# Patient Record
Sex: Male | Born: 1935 | Race: White | Hispanic: No | Marital: Married
Health system: Southern US, Community
[De-identification: ages and names within clinical notes are randomized; demographics above are authoritative.]

---

## 2003-12-26 ENCOUNTER — Other Ambulatory Visit: Payer: Self-pay

## 2004-01-05 ENCOUNTER — Other Ambulatory Visit: Payer: Self-pay

## 2004-07-14 ENCOUNTER — Emergency Department: Payer: Self-pay | Admitting: Emergency Medicine

## 2004-07-31 ENCOUNTER — Other Ambulatory Visit: Payer: Self-pay

## 2004-07-31 ENCOUNTER — Emergency Department: Payer: Self-pay | Admitting: Unknown Physician Specialty

## 2005-11-18 ENCOUNTER — Other Ambulatory Visit: Payer: Self-pay

## 2005-11-18 ENCOUNTER — Inpatient Hospital Stay: Payer: Self-pay | Admitting: Internal Medicine

## 2005-11-19 ENCOUNTER — Other Ambulatory Visit: Payer: Self-pay

## 2005-11-20 ENCOUNTER — Other Ambulatory Visit: Payer: Self-pay

## 2005-11-30 ENCOUNTER — Other Ambulatory Visit: Payer: Self-pay

## 2005-11-30 ENCOUNTER — Inpatient Hospital Stay: Payer: Self-pay | Admitting: Internal Medicine

## 2005-12-01 ENCOUNTER — Other Ambulatory Visit: Payer: Self-pay

## 2005-12-02 ENCOUNTER — Other Ambulatory Visit: Payer: Self-pay

## 2006-06-12 ENCOUNTER — Other Ambulatory Visit: Payer: Self-pay

## 2006-06-12 ENCOUNTER — Emergency Department: Payer: Self-pay | Admitting: Emergency Medicine

## 2007-07-08 ENCOUNTER — Ambulatory Visit: Payer: Self-pay | Admitting: Otolaryngology

## 2007-07-11 ENCOUNTER — Ambulatory Visit: Payer: Self-pay | Admitting: Otolaryngology

## 2007-07-29 ENCOUNTER — Other Ambulatory Visit: Payer: Self-pay

## 2007-07-30 ENCOUNTER — Observation Stay: Payer: Self-pay | Admitting: Internal Medicine

## 2007-07-30 ENCOUNTER — Other Ambulatory Visit: Payer: Self-pay

## 2007-10-24 ENCOUNTER — Inpatient Hospital Stay: Payer: Self-pay | Admitting: Internal Medicine

## 2007-10-24 ENCOUNTER — Other Ambulatory Visit: Payer: Self-pay

## 2007-11-25 ENCOUNTER — Inpatient Hospital Stay: Payer: Self-pay | Admitting: Internal Medicine

## 2008-07-12 ENCOUNTER — Inpatient Hospital Stay: Payer: Self-pay | Admitting: Internal Medicine

## 2008-07-12 ENCOUNTER — Other Ambulatory Visit: Payer: Self-pay

## 2008-07-27 ENCOUNTER — Ambulatory Visit: Payer: Self-pay | Admitting: Family Medicine

## 2011-01-21 ENCOUNTER — Emergency Department: Payer: Self-pay | Admitting: Emergency Medicine

## 2011-04-17 ENCOUNTER — Ambulatory Visit: Payer: Self-pay | Admitting: Family Medicine

## 2011-12-29 ENCOUNTER — Ambulatory Visit: Payer: Self-pay | Admitting: Family Medicine

## 2012-07-17 ENCOUNTER — Ambulatory Visit: Payer: Self-pay | Admitting: Family Medicine

## 2013-08-04 ENCOUNTER — Emergency Department: Payer: Self-pay

## 2013-08-04 LAB — CBC
HCT: 39 % — ABNORMAL LOW (ref 40.0–52.0)
HGB: 13.4 g/dL (ref 13.0–18.0)
MCH: 31.7 pg (ref 26.0–34.0)
MCHC: 34.4 g/dL (ref 32.0–36.0)
Platelet: 147 10*3/uL — ABNORMAL LOW (ref 150–440)
RDW: 14.9 % — ABNORMAL HIGH (ref 11.5–14.5)
WBC: 7.7 10*3/uL (ref 3.8–10.6)

## 2013-08-04 LAB — URINALYSIS, COMPLETE
Bilirubin,UR: NEGATIVE
Blood: NEGATIVE
Glucose,UR: NEGATIVE mg/dL (ref 0–75)
Ketone: NEGATIVE
Leukocyte Esterase: NEGATIVE
Ph: 7 (ref 4.5–8.0)
RBC,UR: <1 /HPF (ref 0–5)
Squamous Epithelial: <1
WBC UR: <1 /HPF (ref 0–5)

## 2013-08-04 LAB — COMPREHENSIVE METABOLIC PANEL
Albumin: 3.2 g/dL — ABNORMAL LOW (ref 3.4–5.0)
Alkaline Phosphatase: 84 U/L (ref 50–136)
Anion Gap: 5 — ABNORMAL LOW (ref 7–16)
BUN: 14 mg/dL (ref 7–18)
Chloride: 107 mmol/L (ref 98–107)
Creatinine: 1.04 mg/dL (ref 0.60–1.30)
Glucose: 91 mg/dL (ref 65–99)
Potassium: 4 mmol/L (ref 3.5–5.1)
SGOT(AST): 24 U/L (ref 15–37)
SGPT (ALT): 16 U/L (ref 12–78)

## 2013-08-04 LAB — LIPASE, BLOOD: Lipase: 74 U/L (ref 73–393)

## 2013-08-05 LAB — CLOSTRIDIUM DIFFICILE BY PCR

## 2013-08-16 ENCOUNTER — Emergency Department: Payer: Self-pay | Admitting: Emergency Medicine

## 2013-08-26 ENCOUNTER — Emergency Department: Payer: Self-pay | Admitting: Emergency Medicine

## 2013-08-26 LAB — COMPREHENSIVE METABOLIC PANEL
Albumin: 2.9 g/dL — ABNORMAL LOW (ref 3.4–5.0)
BUN: 13 mg/dL (ref 7–18)
Calcium, Total: 8.5 mg/dL (ref 8.5–10.1)
Co2: 29 mmol/L (ref 21–32)
Creatinine: 0.81 mg/dL (ref 0.60–1.30)
EGFR (African American): >60
Potassium: 4.2 mmol/L (ref 3.5–5.1)
SGPT (ALT): 15 U/L (ref 12–78)

## 2013-08-26 LAB — LIPASE, BLOOD: Lipase: 101 U/L (ref 73–393)

## 2013-08-26 LAB — URINALYSIS, COMPLETE
Glucose,UR: NEGATIVE mg/dL (ref 0–75)
Ketone: NEGATIVE
Nitrite: NEGATIVE
Ph: 7 (ref 4.5–8.0)
Protein: NEGATIVE
WBC UR: NONE SEEN /HPF (ref 0–5)

## 2013-08-26 LAB — CBC WITH DIFFERENTIAL/PLATELET
Basophil %: 0.4 %
Eosinophil %: 4.6 %
HGB: 12.7 g/dL — ABNORMAL LOW (ref 13.0–18.0)
Lymphocyte %: 12.5 %
MCH: 31.3 pg (ref 26.0–34.0)
MCHC: 33.6 g/dL (ref 32.0–36.0)
MCV: 93 fL (ref 80–100)
Monocyte #: 0.4 x10 3/mm (ref 0.2–1.0)
Monocyte %: 10 %
Neutrophil #: 3 10*3/uL (ref 1.4–6.5)
Neutrophil %: 72.5 %
Platelet: 145 10*3/uL — ABNORMAL LOW (ref 150–440)
RBC: 4.04 10*6/uL — ABNORMAL LOW (ref 4.40–5.90)
WBC: 4.1 10*3/uL (ref 3.8–10.6)

## 2013-11-19 ENCOUNTER — Inpatient Hospital Stay: Payer: Self-pay | Admitting: Surgery

## 2013-11-19 LAB — URINALYSIS, COMPLETE
BACTERIA: NONE SEEN
Bilirubin,UR: NEGATIVE
Blood: NEGATIVE
Glucose,UR: NEGATIVE mg/dL (ref 0–75)
Leukocyte Esterase: NEGATIVE
NITRITE: NEGATIVE
PH: 6 (ref 4.5–8.0)
PROTEIN: NEGATIVE
RBC,UR: 2 /HPF (ref 0–5)
Specific Gravity: 1.034 (ref 1.003–1.030)
Squamous Epithelial: NONE SEEN
WBC UR: <1 /HPF (ref 0–5)

## 2013-11-19 LAB — COMPREHENSIVE METABOLIC PANEL
ANION GAP: 7 (ref 7–16)
Albumin: 3.6 g/dL (ref 3.4–5.0)
Alkaline Phosphatase: 85 U/L
BILIRUBIN TOTAL: 0.4 mg/dL (ref 0.2–1.0)
BUN: 17 mg/dL (ref 7–18)
CHLORIDE: 103 mmol/L (ref 98–107)
Calcium, Total: 9.5 mg/dL (ref 8.5–10.1)
Co2: 27 mmol/L (ref 21–32)
Creatinine: 0.83 mg/dL (ref 0.60–1.30)
EGFR (Non-African Amer.): >60
Glucose: 181 mg/dL — ABNORMAL HIGH (ref 65–99)
Osmolality: 280 (ref 275–301)
Potassium: 3.8 mmol/L (ref 3.5–5.1)
SGOT(AST): 18 U/L (ref 15–37)
SGPT (ALT): 17 U/L (ref 12–78)
SODIUM: 137 mmol/L (ref 136–145)
TOTAL PROTEIN: 7 g/dL (ref 6.4–8.2)

## 2013-11-19 LAB — CBC WITH DIFFERENTIAL/PLATELET
Basophil #: 0.1 10*3/uL (ref 0.0–0.1)
Basophil %: 0.5 %
EOS ABS: 0.1 10*3/uL (ref 0.0–0.7)
EOS PCT: 0.5 %
HCT: 41.9 % (ref 40.0–52.0)
HGB: 14.1 g/dL (ref 13.0–18.0)
LYMPHS ABS: 0.6 10*3/uL — AB (ref 1.0–3.6)
LYMPHS PCT: 4.8 %
MCH: 31.8 pg (ref 26.0–34.0)
MCHC: 33.6 g/dL (ref 32.0–36.0)
MCV: 95 fL (ref 80–100)
Monocyte #: 0.4 x10 3/mm (ref 0.2–1.0)
Monocyte %: 3.1 %
NEUTROS ABS: 10.9 10*3/uL — AB (ref 1.4–6.5)
Neutrophil %: 91.1 %
Platelet: 147 10*3/uL — ABNORMAL LOW (ref 150–440)
RBC: 4.43 10*6/uL (ref 4.40–5.90)
RDW: 14.9 % — AB (ref 11.5–14.5)
WBC: 11.9 10*3/uL — ABNORMAL HIGH (ref 3.8–10.6)

## 2013-11-19 LAB — TROPONIN I: Troponin-I: <0.02 ng/mL

## 2013-11-19 LAB — LIPASE, BLOOD: LIPASE: 132 U/L (ref 73–393)

## 2013-11-20 LAB — BASIC METABOLIC PANEL
Anion Gap: 5 — ABNORMAL LOW (ref 7–16)
BUN: 14 mg/dL (ref 7–18)
CO2: 27 mmol/L (ref 21–32)
CREATININE: 0.79 mg/dL (ref 0.60–1.30)
Calcium, Total: 8.5 mg/dL (ref 8.5–10.1)
Chloride: 107 mmol/L (ref 98–107)
EGFR (African American): >60
EGFR (Non-African Amer.): >60
GLUCOSE: 100 mg/dL — AB (ref 65–99)
Osmolality: 278 (ref 275–301)
Potassium: 4 mmol/L (ref 3.5–5.1)
Sodium: 139 mmol/L (ref 136–145)

## 2013-11-20 LAB — CBC WITH DIFFERENTIAL/PLATELET
BASOS ABS: 0 10*3/uL (ref 0.0–0.1)
Basophil %: 0.3 %
EOS ABS: 0 10*3/uL (ref 0.0–0.7)
Eosinophil %: 0.7 %
HCT: 39.3 % — AB (ref 40.0–52.0)
HGB: 13.1 g/dL (ref 13.0–18.0)
Lymphocyte #: 0.4 10*3/uL — ABNORMAL LOW (ref 1.0–3.6)
Lymphocyte %: 9 %
MCH: 31.5 pg (ref 26.0–34.0)
MCHC: 33.3 g/dL (ref 32.0–36.0)
MCV: 95 fL (ref 80–100)
Monocyte #: 0.6 x10 3/mm (ref 0.2–1.0)
Monocyte %: 11.8 %
NEUTROS PCT: 78.2 %
Neutrophil #: 3.8 10*3/uL (ref 1.4–6.5)
Platelet: 127 10*3/uL — ABNORMAL LOW (ref 150–440)
RBC: 4.16 10*6/uL — ABNORMAL LOW (ref 4.40–5.90)
RDW: 14.6 % — AB (ref 11.5–14.5)
WBC: 4.9 10*3/uL (ref 3.8–10.6)

## 2013-11-21 LAB — BASIC METABOLIC PANEL
ANION GAP: 3 — AB (ref 7–16)
BUN: 11 mg/dL (ref 7–18)
CHLORIDE: 110 mmol/L — AB (ref 98–107)
Calcium, Total: 8.3 mg/dL — ABNORMAL LOW (ref 8.5–10.1)
Co2: 28 mmol/L (ref 21–32)
Creatinine: 0.75 mg/dL (ref 0.60–1.30)
EGFR (African American): >60
Glucose: 91 mg/dL (ref 65–99)
Osmolality: 280 (ref 275–301)
Potassium: 3.7 mmol/L (ref 3.5–5.1)
Sodium: 141 mmol/L (ref 136–145)

## 2013-11-21 LAB — CBC WITH DIFFERENTIAL/PLATELET
BASOS ABS: 0 10*3/uL (ref 0.0–0.1)
Basophil %: 0.3 %
EOS ABS: 0.2 10*3/uL (ref 0.0–0.7)
Eosinophil %: 3.2 %
HCT: 37.7 % — ABNORMAL LOW (ref 40.0–52.0)
HGB: 12.9 g/dL — ABNORMAL LOW (ref 13.0–18.0)
Lymphocyte #: 0.5 10*3/uL — ABNORMAL LOW (ref 1.0–3.6)
Lymphocyte %: 9.7 %
MCH: 32.2 pg (ref 26.0–34.0)
MCHC: 34.1 g/dL (ref 32.0–36.0)
MCV: 95 fL (ref 80–100)
MONO ABS: 0.6 x10 3/mm (ref 0.2–1.0)
Monocyte %: 13 %
NEUTROS PCT: 73.8 %
Neutrophil #: 3.5 10*3/uL (ref 1.4–6.5)
PLATELETS: 126 10*3/uL — AB (ref 150–440)
RBC: 4 10*6/uL — ABNORMAL LOW (ref 4.40–5.90)
RDW: 15.2 % — ABNORMAL HIGH (ref 11.5–14.5)
WBC: 4.8 10*3/uL (ref 3.8–10.6)

## 2013-12-07 DEATH — deceased

## 2015-01-30 NOTE — Discharge Summary (Signed)
PATIENT NAMJanann August:  Shaul, Mihran P MR#:  [id number] DATE OF BIRTH:  10-Jan-1913  DATE OF ADMISSION:  11/19/2013 DATE OF DISCHARGE:  11/21/2013  DISCHARGE DIAGNOSES: 1.  Partial small bowel obstruction, resolved. 2.  Coronary artery disease. 3.  Sick sinus syndrome. 4.  Hypertension. 5.  Chronic obstructive pulmonary disease. 6.  Prostate cancer.   PROCEDURES: None.   HISTORY OF PRESENT ILLNESS AND HOSPITAL COURSE: This is a patient who was admitted to the hospital with nausea, vomiting, and failure to pass gas with a work-up that suggested partial small bowel obstruction. He was treated with a nasogastric tube, which took some of the pressure off of his bowel and with time he started passing gas. His x-rays improved. His examination improved, and he was given Dulcolax suppositories as well as milk of magnesia. He is having stools, is passing gas, has no nausea or vomiting, and is tolerating a regular diet. He is discharged in stable condition with no new medications, to follow up with his primary care physician in 2 weeks. ____________________________ Adah Salvageichard E. Excell Seltzerooper, MD rec:sb D: 11/21/2013 17:05:13 ET T: 11/21/2013 17:23:58 ET JOB#: [id number]  cc: Adah Salvageichard E. Excell Seltzerooper, MD, <Dictator> Lattie HawICHARD E Ann Groeneveld MD ELECTRONICALLY SIGNED 11/21/2013 18:49

## 2015-01-30 NOTE — Consult Note (Signed)
PATIENT NAMJanann Burns:  Burns, Manuel P MR#:  [id number] DATE OF BIRTH:  [birth year]  DATE OF CONSULTATION:  11/19/2013  REFERRING PHYSICIAN:  Carmie Endalph L. Ely III, MD CONSULTING PHYSICIAN:  Ramonita LabAruna Patterson Hollenbaugh, MD  PRIMARY CARE PHYSICIAN: Demetrios Isaacsonald E. Sherrie MustacheFisher, MD  REASON FOR MEDICAL CONSULTATION: Medical management.   HISTORY OF PRESENT ILLNESS: The patient is a 81 year old elderly male with past medical history of melanoma, hypertension, COPD, coronary artery disease and GERD, who is brought into the ER for dry heaving and abdominal pain. According to the patient's daughter at bedside, the patient was in his usual state of health until yesterday, and then he started developing abdominal pain and felt very nauseous since yesterday evening. The patient was brought into the ER and diagnosed with small bowel obstruction, and the patient is admitted to Dr. Marlowe KaysEly's service. NG tube is dropped to decompress abdominal pressure. The patient is still complaining of abdominal pain. Daughter is at bedside. Hospitalist team is consulted regarding medical management of his medical problems. The patient denies any chest pain or shortness of breath. Denies dizziness or loss of consciousness. No similar complaints in the past. Daughter is requesting for DNR, and if necessary, she is considering comfort care measures.    PAST MEDICAL HISTORY:  1. Coronary artery disease.  2. COPD, lives on 2 liters of home oxygen.  3. Hyperlipidemia.  4. Tachybrady syndrome status post pacemaker.  5. Prostate cancer status post radiation.  6. Diverticulosis. 7. Anal outlet obstruction during the most recent admission, rectal bleeding.   PAST SURGICAL HISTORY: Pacemaker placement.  ALLERGIES: HE IS ALLERGIC TO METOPROLOL.   HOME MEDICATIONS:  1. Tylenol 500 mg 2 tablets p.o. once a day. 2. Spiriva 18 mcg inhalation once daily. 3. Prilosec 20 mg p.o. 2 times a day. 4. MiraLax 1 dose once daily. 5. Lipitor 10 mg once daily.  6. Isosorbide mononitrate  30 mg 1 tablet p.o. 2 times a day.  7. Hydrocortisone suppository as needed for hemorrhoids. 8. Colace 100 mg p.o. once daily.  9. Aspirin 81 mg once daily. 10. Cartia 120 mg extended release 1 capsule p.o. once daily.  11. Alprazolam 0.5 mg 1 tablet p.o. once a day as needed for anxiety.   FAMILY HISTORY: Mother died at age 79. Father died with myocardial infarction at age 772.   PSYCHOSOCIAL HISTORY: Lives at home, lives alone. Daughter comes in and stays with him during nights. He used to smoke cigars over 50 years ago. He quit smoking.   REVIEW OF SYSTEMS:  CONSTITUTIONAL: Denies any fever or fatigue.  EYES: Denies blurry vision or double vision.  ENT: Denies epistaxis, discharge.  RESPIRATION: Denies cough, COPD.  CARDIOVASCULAR: No chest pain, palpitations.  GASTROINTESTINAL: Complaining of nausea and episodes of vomiting and diffuse abdominal pain.  GENITOURINARY: No dysuria or hematuria.  ENDOCRINE: Denies polyuria, nocturia or thyroid problems.  HEMATOLOGIC AND LYMPHATIC: No anemia, easy bruising or bleeding.  INTEGUMENTARY: No acne, rash, lesions.  MUSCULOSKELETAL: No joint pain in the neck and back. Complaining of abdominal pain.  NEUROLOGIC: Denies any vertigo, ataxia.  PSYCHIATRIC: No ADD, OCD.   PHYSICAL EXAMINATION: VITAL SIGNS: Temperature 98.7, pulse 96, respirations 20, blood pressure 114/48, pulse oximetry is 97%.  GENERAL APPEARANCE: Not under acute distress, uncomfortable from NG tube. HEENT: Normocephalic, atraumatic. Pupils are equally reacting to light and accommodation. No scleral icterus. No conjunctival injection. No sinus tenderness. No postnasal drip. Dry mucous membranes.  NECK: Supple. No JVD. No thyromegaly. Range of motion is intact.  LUNGS: Clear to auscultation bilaterally. No accessory muscle usage. No anterior chest wall tenderness on palpation.  CARDIAC: S1, S2 normal. Regular rate and rhythm. No murmurs.  GASTROINTESTINAL: Semirigid. No bowel  sounds are heard. Generalized abdominal tenderness is present. Positive rebound tenderness.  NEUROLOGIC: Awake, alert, oriented x3. Motor and sensory grossly intact. Reflexes are 2+.  EXTREMITIES: No edema. No cyanosis. No clubbing.  SKIN: Warm to touch. Normal turgor. Small bruises are noticed on the anterior chest wall and upper torso.  MUSCULOSKELETAL: No joint effusion, tenderness, erythema. PSYCHIATRIC: Normal mood and affect.   LABORATORY AND IMAGING STUDIES: Urinalysis: Nitrites and leukocyte esterase are negative. Lactic acid 1.1. Troponin less than 0.02. LFTs are normal. CHEM-8 is normal except glucose is elevated at 181. CBC: White count is at 11.9, platelet count is at 147. The rest of the labs are normal.   ASSESSMENT AND PLAN: A 81 year old Caucasian admitted to Dr. Marlowe Kays service for small bowel obstruction. Internal medicine consult is placed for medical management of his medical problems.   1. Small bowel obstruction. The patient will be on IV fluids, n.p.o. and pain management per Dr. Michela Pitcher.  2. History of gastroesophageal reflux disease. The patient will be on IV Protonix.  3. Hypertension. Blood pressure is on the lower side. If necessary, will give IV antihypertensives.  4. History of chronic obstructive pulmonary disease. Will provide him DuoNeb nebulizer treatments as-needed basis.  5. History of melanoma.  6. Will provide him gastrointestinal and deep vein thrombosis prophylaxis.   CODE STATUS: He is DNR. If necessary, daughter is considering comfort care measures. She will discuss with attending physician, Dr. Michela Pitcher, in detail.   Plan of care discussed in detail with the patient and daughter at bedside. They are aware of the plan.   TOTAL TIME SPENT ON MEDICAL CONSULTATION: 45 minutes.   ____________________________ Ramonita Lab, MD ag:lb D: 11/19/2013 07:22:02 ET T: 11/19/2013 07:51:17 ET JOB#: [id number]  cc: Ramonita Lab, MD, <Dictator> Demetrios Isaacs. Sherrie Mustache, MD Ramonita Lab MD ELECTRONICALLY SIGNED 12/04/2013 18:43

## 2015-01-30 NOTE — H&P (Signed)
PATIENT NAMELELAN, Burns MR#:  [id number] DATE OF BIRTH:  1913/08/30  DATE OF ADMISSION:  11/19/2013  PRIMARY CARE PHYSICIAN: Demetrios Isaacs. Sherrie Mustache, MD  ADMITTING PHYSICIAN: Quentin Ore III, MD  CHIEF COMPLAINT: Abdominal pain, nausea, dry heaves.   BRIEF HISTORY: Manuel Burns is a 81 year old gentleman seen in the Emergency Room with a short history of abdominal pain, nausea, dry heaves and 1 episode of vomiting. He had had diarrhea for several days. He lives in an assisted living facility, and his daughter notes that he was treated with some Imodium for persistent diarrhea. The diarrhea stopped, and he has not had a bowel movement since that time. He is not passing gas. He began to have crampy abdominal pain this evening, followed by significant nausea and dry heaves. He did vomit once he came to the Emergency Room. He has never had previous similar symptoms. He denies any other significant GI history. However, reviewing his chart, he does have a history of diverticular bleed and a previous appendectomy. Denies any history of hepatitis, yellow jaundice, pancreatitis, peptic ulcer disease, gallbladder disease. He denies any other abdominal surgery. He does have a history of significant coronary artery disease and sick sinus syndrome. He had a previous pacemaker placed a number of years ago by Dr. Darrold Junker, followed up recently in January 2015. He has a history of hypertension, chronic obstructive lung disease. He lives in assisted living, and his daughter is his primary caregiver. He does have history of prostate cancer in addition.   ADMISSION MEDICATIONS: Include: 1. Alprazolam 0.5 mg p.o. daily p.r.n. 2. Aspirin 81 mg p.o. daily. 3. Balneol topical lotion b.i.d. 4. Cartia XT 120 mg once a day. 5. Colace 100 mg once a day.  6. Hydrocortisone rectal suppositories once a day p.r.n. 7. Isosorbide mononitrate 30 mg b.i.d. 8. Lipitor 10 mg p.o. daily. 9. MiraLax powder 17 grams per day. 10.  Prilosec 20 mg p.o. daily. 11. Spiriva 18 mcg inhaled once a day. 12. Tylenol for pain.   ALLERGIES: HE IS ALLERGIC TO METOPROLOL, WHICH ALLEGEDLY CAUSES ANAPHYLAXIS.   REVIEW OF SYSTEMS: Otherwise almost impossible. Denies any other major problems other than relating that he is 81 years old.   PHYSICAL EXAMINATION:  GENERAL: He is relatively alert, but mildly hard of hearing.  VITAL SIGNS: His blood pressure is 115/55, heart rate is 96 and regular. He is afebrile.  HEENT: Reveals some skin lesions. He does have a history of squamous cell carcinoma and melanoma. He has no facial deformities. No pupillary abnormalities.  NECK: Supple, nontender, with some mild bruising. Midline trachea. No adenopathy.  CHEST: Has very distant breath sounds, but he does appear to be clear. He has normal pulmonary excursion.  CARDIAC: Reveals what appears to be a normal sinus rhythm to me without evidence of any murmurs or gallops.  ABDOMEN: Mildly distended, minimally tender, with no rebound, no guarding, no point tenderness. No hernias are noted, and his abdomen is quiet.  LOWER EXTREMITIES: Reveal full range of motion, no deformities. Good distal pulses.  PSYCHIATRIC: Reveals relatively normal orientation with some confusion due to his hearing loss, but he does appear to be well oriented.   IMPRESSION: I have independently reviewed his CT scan. The scan does demonstrate significant fecalization of his distal small bowel and a large amount of stool burden in the colon. I cannot detect the transition point identified by the radiology service, but his stomach and small bowel do appear to be significantly distended.  No free air is noted. White blood cell count is 12,000. His electrolytes were otherwise unremarkable.   PLAN: I have discussed the situation with the family. They want to pursue all avenues at the present time. Will plan to admit him to the hospital for nasogastric decompression and IV rehydration. We  will repeat his laboratory values and his plain films, have the internal medicine service see him with regard to his multiple medical problems. If he does not decompress in short order, then we will discuss the options for surgical intervention.     ____________________________ Carmie Endalph L. Ely III, MD rle:lb D: 11/19/2013 05:12:18 ET T: 11/19/2013 06:07:08 ET JOB#: [id number]  cc: Carmie Endalph L. Ely III, MD, <Dictator> Marcina MillardAlexander Paraschos, MD Demetrios Isaacsonald E. Sherrie MustacheFisher, MD Quentin OreALPH L ELY MD ELECTRONICALLY SIGNED 11/20/2013 4:01

## 2015-09-09 IMAGING — CT CT ABD-PELV W/ CM
1 of 3 series · 13 of 32 positions shown, 18 images · IV contrast (isovue)
Comparison: 08/04/2013

CLINICAL DATA: Diarrhea, upper abdominal pain

EXAM:
CT ABDOMEN AND PELVIS WITH CONTRAST
TECHNIQUE: Multidetector CT imaging of the abdomen and pelvis was performed
using the standard protocol following bolus administration of
intravenous contrast. Sagittal and coronal MPR images reconstructed
from axial data set.
CONTRAST:  Dilute oral contrast.  100 cc Isovue 370 IV

[Series 2: routine abd pel with · axial · 0.73mm/px · z∈[-402,+23]mm · 13 of 97 slices shown, 18 images]
[im 6/97  soft-tissue]
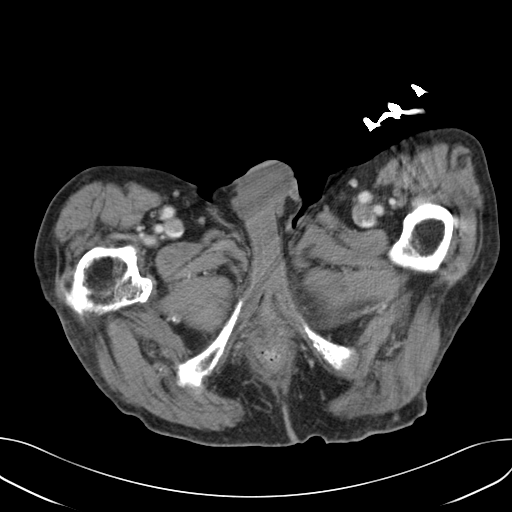
[im 6/97  bone]
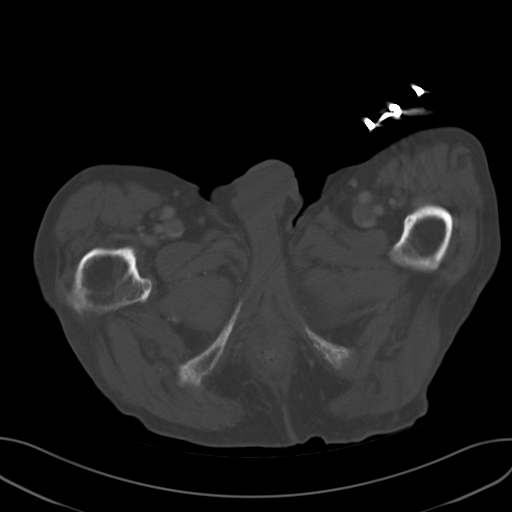
[im 17/97  soft-tissue]
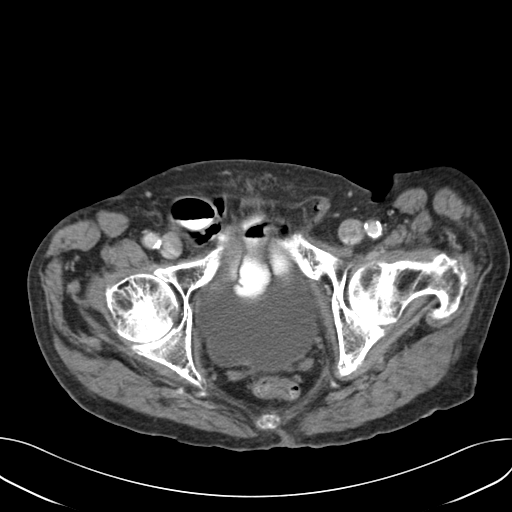
[im 22/97  soft-tissue]
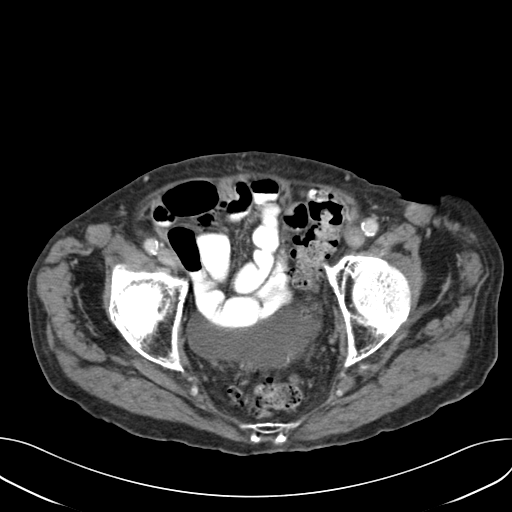
[im 27/97  soft-tissue]
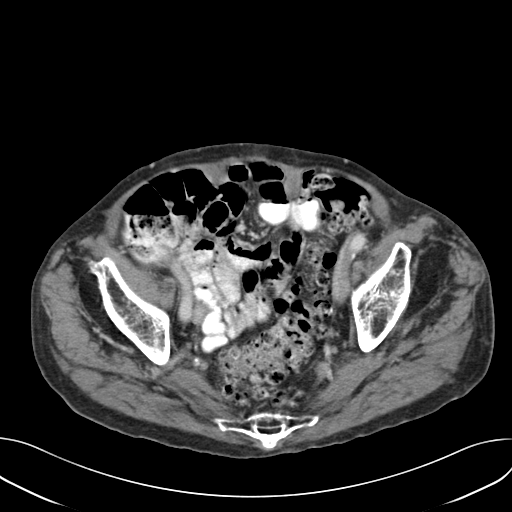
[im 38/97  soft-tissue]
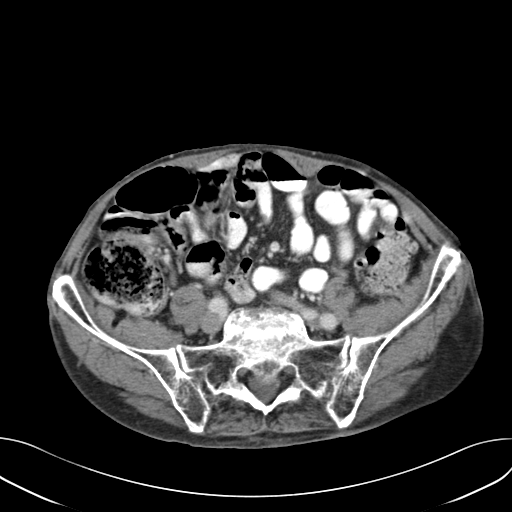
[im 43/97  soft-tissue]
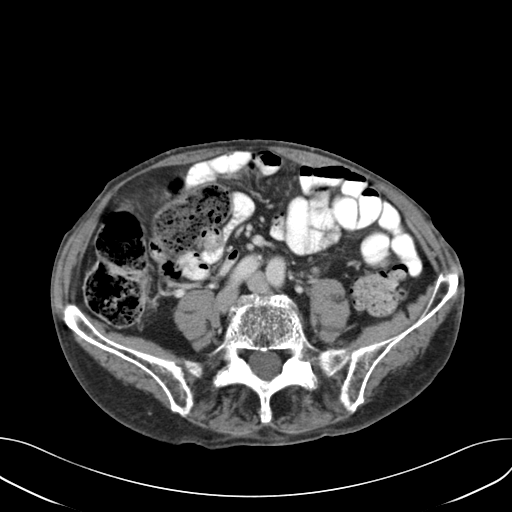
[im 54/97  soft-tissue]
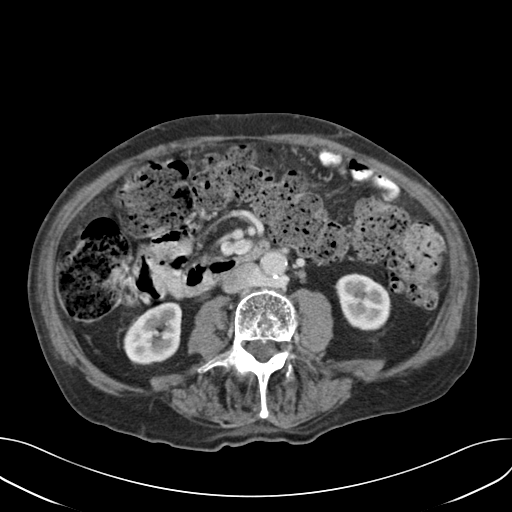
[im 59/97  soft-tissue]
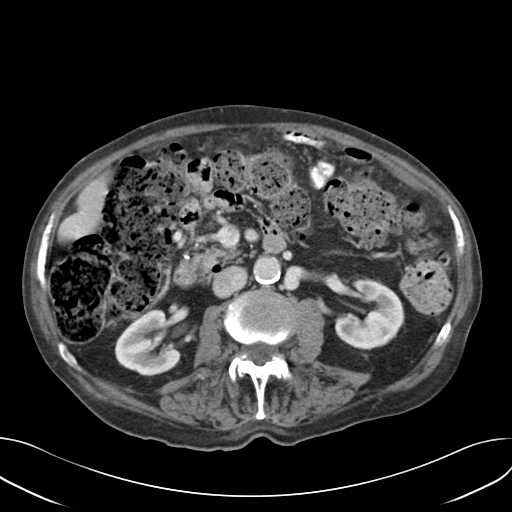
[im 70/97  soft-tissue]
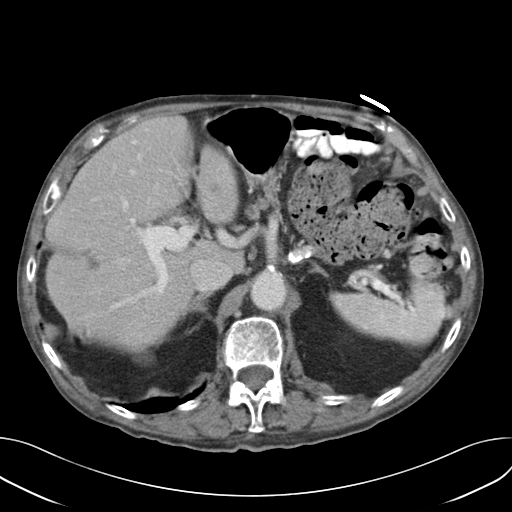
[im 70/97  bone]
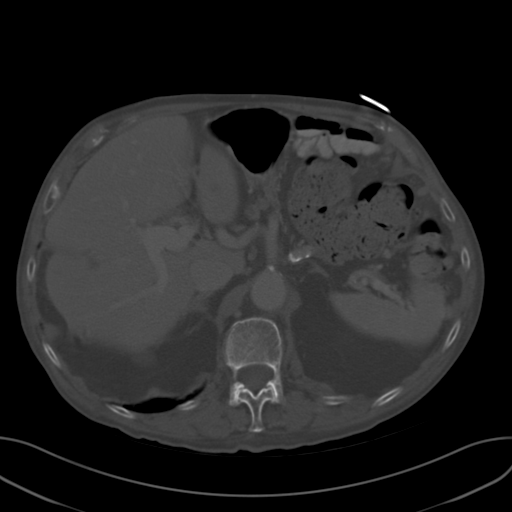
[im 75/97  soft-tissue]
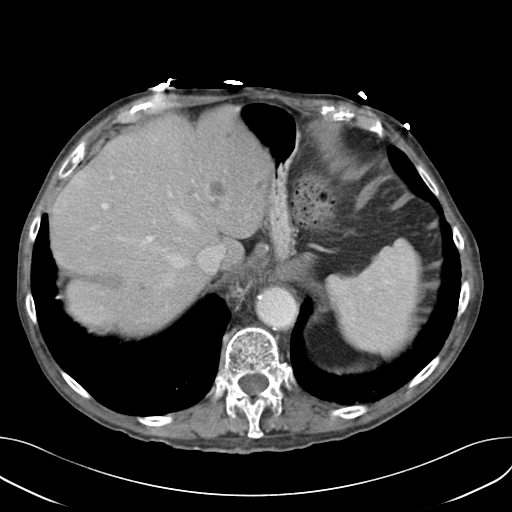
[im 75/97  lung]
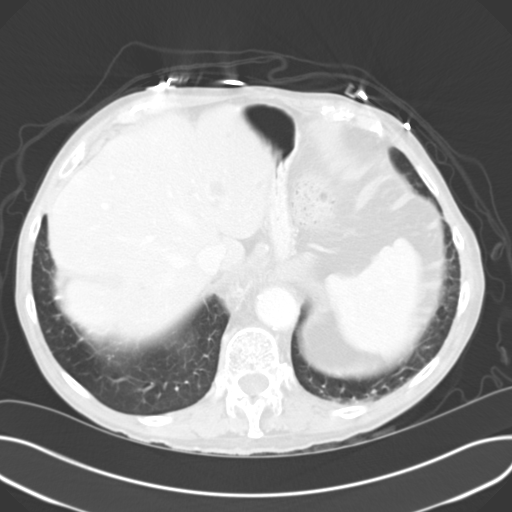
[im 81/97  soft-tissue]
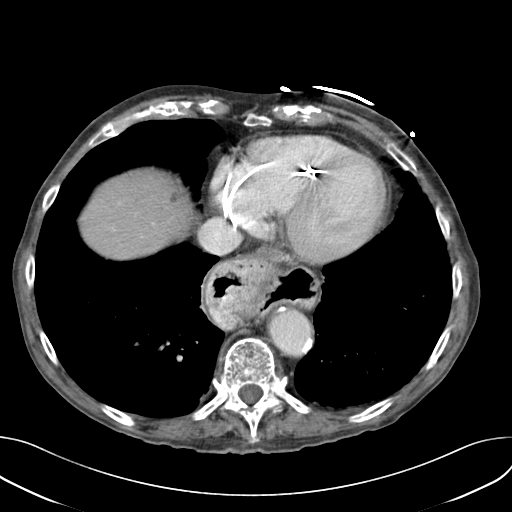
[im 81/97  lung]
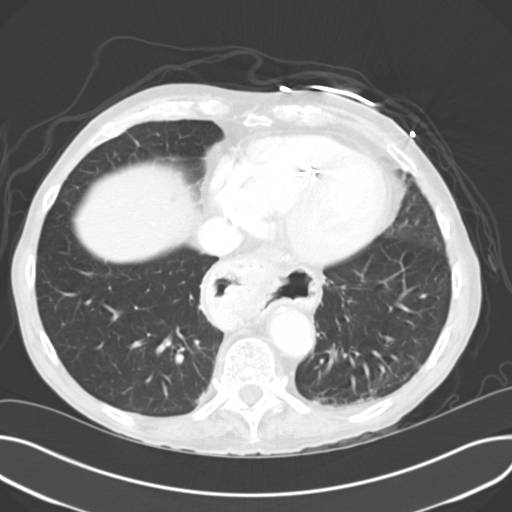
[im 86/97  lung]
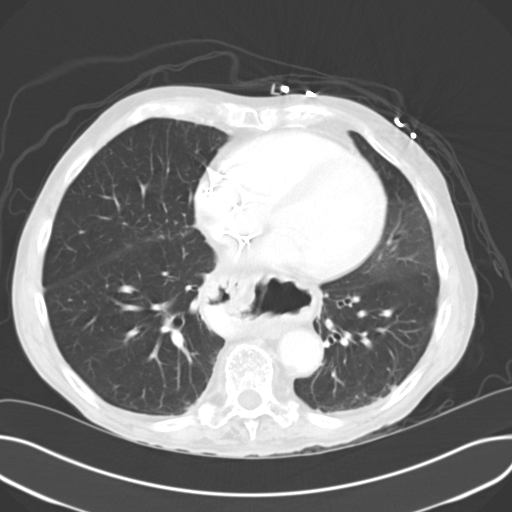
[im 91/97  soft-tissue]
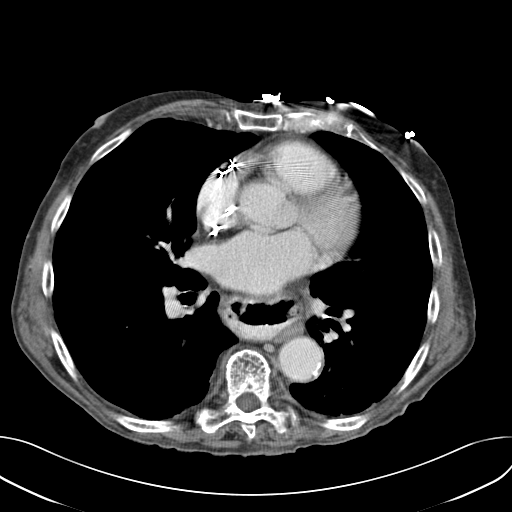
[im 91/97  lung]
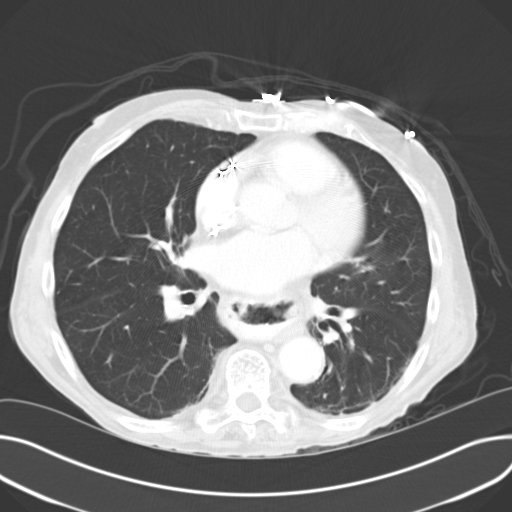

[13 of 32 positions shown; findings below may reference images not displayed]

FINDINGS: Pacemaker leads right atrium and right ventricle.

COPD changes at lung bases with minimal left base atelectasis.

Large hiatal hernia.

Several cystic lesions within liver, largest 2.3 x 1.8 cm image 25.

Additional low-attenuation area with calcification at right lobe
liver 2.1 x 1.1 cm image 27.

Atrophic pancreas.

Retroaortic left renal vein.

Cyst upper pole right kidney 2.3 x 2.1 cm image 30.

Liver, spleen, pancreas, kidneys, and adrenal glands otherwise
normal.

Increased stool throughout colon.

Sigmoid diverticulosis without definite evidence of diverticulitis.

Appendix not definitely visualized.

Small duodenal diverticulum.

Stomach and remaining small bowel loops unremarkable.

Scattered atherosclerotic calcification without aneurysm.

No mass, adenopathy, free fluid or inflammatory process.

Area of questionable wall thickening at the splenic flexure/proximal
descending colon on the previous exam is no longer identified.

Right inguinal hernia containing nonobstructed bowel loop.

Dilated left internal inguinal ring.

Unremarkable bladder and ureters.

Retroaortic left renal vein.

Diffuse osseous demineralization with mild superior endplate
compression deformity of L1 vertebral body and scattered
degenerative disc disease changes.

Question large Schmorl's node at inferior L3
IMPRESSION: Increased stool throughout colon.

Sigmoid diverticulosis without evidence of diverticulitis or
colitis.

Large hiatal and additional right inguinal hernias.

Hepatic and right renal cysts with an additional nonspecific
low-attenuation lesion with a calcification at the superior aspect
of the liver, adjacent to a prominent hepatic groove secondary to a
diaphragmatic muscular slip.

Finding is nonspecific but increased in size since 4992.

Followup nonemergent characterization of this indeterminate lesion
by MR imaging with and without contrast recommended.
